# Patient Record
Sex: Male | Born: 1970 | Race: White | Hispanic: No | Marital: Married | Smoking: Never smoker
Health system: Southern US, Community
[De-identification: ages and names within clinical notes are randomized; demographics above are authoritative.]

## PROBLEM LIST (undated history)

## (undated) DIAGNOSIS — F419 Anxiety disorder, unspecified: Secondary | ICD-10-CM

## (undated) DIAGNOSIS — J189 Pneumonia, unspecified organism: Secondary | ICD-10-CM

## (undated) DIAGNOSIS — J45909 Unspecified asthma, uncomplicated: Secondary | ICD-10-CM

## (undated) HISTORY — DX: Unspecified asthma, uncomplicated: J45.909

## (undated) HISTORY — PX: WISDOM TOOTH EXTRACTION: SHX21

## (undated) HISTORY — PX: VASECTOMY: SHX75

## (undated) HISTORY — PX: NO PAST SURGERIES: SHX2092

---

## 2009-12-26 ENCOUNTER — Ambulatory Visit: Payer: Self-pay | Admitting: Family Medicine

## 2009-12-26 DIAGNOSIS — J069 Acute upper respiratory infection, unspecified: Secondary | ICD-10-CM | POA: Insufficient documentation

## 2010-11-29 NOTE — Assessment & Plan Note (Signed)
Summary: Cough-dry x  1dy rm 3   Vital Signs:  Patient Profile:   40 Years Old Male CC:      Cold & URI symptoms Height:     69 inches Weight:      217 pounds O2 Sat:      100 % O2 treatment:    Room Air Temp:     98.6 degrees F oral Pulse rate:   102 / minute Pulse rhythm:   regular Resp:     16 per minute BP sitting:   125 / 83  (right arm) Cuff size:   regular  Vitals Entered By: Areta Haber CMA (December 26, 2009 1:38 PM)                  Current Allergies: No known allergies History of Present Illness Chief Complaint: Cold & URI symptoms History of Present Illness: Subjective: Patient complains of non-productive cough that started 2 days ago, then yesterday developed chills and myalgias.  He has a past history of mild asthma as a child.  He has not had a flu shot.  He is flying to [place] in 3 days. No sore throat No pleuritic pain ? wheezing No nasal congestion No post-nasal drainage No sinus pain/pressure No itchy/red eyes No earache No hemoptysis No SOB No nausea No vomiting No abdominal pain No diarrhea No skin rashes + fatigue +  myalgias No headache Used OTC meds without relief   Current Problems: URI (ICD-465.9)   Current Meds ALKA-SELTZER PLUS COLD 2-7.8-325 MG TBEF (CHLORPHEN-PHENYLEPH-ASA) As directed AZITHROMYCIN 250 MG TABS (AZITHROMYCIN) Two tabs by mouth on day 1, then 1 tab daily on days 2 through 5 BENZONATATE 200 MG CAPS (BENZONATATE) One by mouth hs as needed cough  REVIEW OF SYSTEMS Constitutional Symptoms      Denies fever, chills, night sweats, weight loss, weight gain, and fatigue.  Eyes       Denies change in vision, eye pain, eye discharge, glasses, contact lenses, and eye surgery. Ear/Nose/Throat/Mouth       Denies hearing loss/aids, change in hearing, ear pain, ear discharge, dizziness, frequent runny nose, frequent nose bleeds, sinus problems, sore throat, hoarseness, and tooth pain or bleeding.  Respiratory  Complains of dry cough.      Denies productive cough, wheezing, shortness of breath, asthma, bronchitis, and emphysema/COPD.  Cardiovascular       Denies murmurs, chest pain, and tires easily with exhertion.    Gastrointestinal       Denies stomach pain, nausea/vomiting, diarrhea, constipation, blood in bowel movements, and indigestion. Genitourniary       Denies painful urination, kidney stones, and loss of urinary control. Neurological       Denies paralysis, seizures, and fainting/blackouts. Musculoskeletal       Denies muscle pain, joint pain, joint stiffness, decreased range of motion, redness, swelling, muscle weakness, and gout.  Skin       Denies bruising, unusual mles/lumps or sores, and hair/skin or nail changes.  Psych       Denies mood changes, temper/anger issues, anxiety/stress, speech problems, depression, and sleep problems. Other Comments: x 1 dy.    Past History:  Past Medical History: Unremarkable  Past Surgical History: Denies surgical history  Social History: Married Never Smoked Alcohol use-yes - 1 glass of red wine daily Drug use-no Regular exercise-no Smoking Status:  never Drug Use:  no Does Patient Exercise:  no   Objective:  Appearance:  Patient appears healthy, stated age, and in  no acute distress  Eyes:  Pupils are equal, round, and reactive to light and accomdation.  Extraocular movement is intact.  Conjunctivae are not inflamed.  Ears:  Canals normal.  Tympanic membranes normal.   Nose:  Normal septum.  Normal turbinates, mildly congested.   No sinus tenderness present.  Pharynx:  Normal  Neck:  Supple.  No adenopathy is present.  No thyromegaly is present  Lungs:   Faint bilateral wheezes present.  No rales or rhonchi.  Breath sounds are equal.  Heart:  Regular rate and rhythm without murmurs, rubs, or gallops.  Abdomen:  Nontender without masses or hepatosplenomegaly.  Bowel sounds are present.  No CVA or flank tenderness.  Extremities:   No edema.   Assessment New Problems: URI (ICD-465.9)  Viral URI? vs influenza?  Plan New Medications/Changes: BENZONATATE 200 MG CAPS (BENZONATATE) One by mouth hs as needed cough  #12 x 0, 12/26/2009, Donna Christen MD AZITHROMYCIN 250 MG TABS (AZITHROMYCIN) Two tabs by mouth on day 1, then 1 tab daily on days 2 through 5  #6 tabs x 0, 12/26/2009, Donna Christen MD  New Orders: New Patient Level III [phone] Planning Comments:   Begin Z-pack, expectorant daytime, cough suppressant at night, rest, increased fluids. Follow-up with PCP if not improved 10 to 14 days.   The patient and/or caregiver has been counseled thoroughly with regard to medications prescribed including dosage, schedule, interactions, rationale for use, and possible side effects and they verbalize understanding.  Diagnoses and expected course of recovery discussed and will return if not improved as expected or if the condition worsens. Patient and/or caregiver verbalized understanding.  Prescriptions: BENZONATATE 200 MG CAPS (BENZONATATE) One by mouth hs as needed cough  #12 x 0   Entered and Authorized by:   Donna Christen MD   Signed by:   Donna Christen MD on 12/26/2009   Method used:   Print then Give to Patient   RxID:   [id number] AZITHROMYCIN 250 MG TABS (AZITHROMYCIN) Two tabs by mouth on day 1, then 1 tab daily on days 2 through 5  #6 tabs x 0   Entered and Authorized by:   Donna Christen MD   Signed by:   Donna Christen MD on 12/26/2009   Method used:   Print then Give to Patient   RxID:   [id number]   Patient Instructions: 1)  May use Mucinex  (guaifenesin) twice daily for congestion. 2)  Increase fluid intake, rest. 3)  May take Ibuprofen 200mg , 4 tabs every 8 hours with food  4)  May use Afrin nasal spray (or generic oxymetazoline) twice daily for about 5 days if sinus congestion develops.   5)  Followup with family doctor if not improving 10 to 14 days.

## 2018-03-11 DIAGNOSIS — J45909 Unspecified asthma, uncomplicated: Secondary | ICD-10-CM | POA: Diagnosis not present

## 2018-03-11 DIAGNOSIS — J309 Allergic rhinitis, unspecified: Secondary | ICD-10-CM | POA: Diagnosis not present

## 2018-05-28 DIAGNOSIS — F411 Generalized anxiety disorder: Secondary | ICD-10-CM | POA: Diagnosis not present

## 2018-05-28 DIAGNOSIS — J45909 Unspecified asthma, uncomplicated: Secondary | ICD-10-CM | POA: Diagnosis not present

## 2018-06-07 DIAGNOSIS — J4520 Mild intermittent asthma, uncomplicated: Secondary | ICD-10-CM | POA: Diagnosis not present

## 2018-08-19 DIAGNOSIS — Z13220 Encounter for screening for lipoid disorders: Secondary | ICD-10-CM | POA: Diagnosis not present

## 2018-08-19 DIAGNOSIS — Z23 Encounter for immunization: Secondary | ICD-10-CM | POA: Diagnosis not present

## 2018-08-19 DIAGNOSIS — Z0000 Encounter for general adult medical examination without abnormal findings: Secondary | ICD-10-CM | POA: Diagnosis not present

## 2018-08-19 DIAGNOSIS — F411 Generalized anxiety disorder: Secondary | ICD-10-CM | POA: Diagnosis not present

## 2018-08-19 DIAGNOSIS — J45909 Unspecified asthma, uncomplicated: Secondary | ICD-10-CM | POA: Diagnosis not present

## 2018-09-15 DIAGNOSIS — M545 Low back pain: Secondary | ICD-10-CM | POA: Diagnosis not present

## 2018-09-25 DIAGNOSIS — H524 Presbyopia: Secondary | ICD-10-CM | POA: Diagnosis not present

## 2018-12-25 DIAGNOSIS — J309 Allergic rhinitis, unspecified: Secondary | ICD-10-CM | POA: Diagnosis not present

## 2018-12-25 DIAGNOSIS — J45909 Unspecified asthma, uncomplicated: Secondary | ICD-10-CM | POA: Diagnosis not present

## 2019-01-23 DIAGNOSIS — J4520 Mild intermittent asthma, uncomplicated: Secondary | ICD-10-CM | POA: Diagnosis not present

## 2019-02-19 DIAGNOSIS — J4541 Moderate persistent asthma with (acute) exacerbation: Secondary | ICD-10-CM | POA: Diagnosis not present

## 2019-02-19 DIAGNOSIS — F411 Generalized anxiety disorder: Secondary | ICD-10-CM | POA: Diagnosis not present

## 2019-02-25 DIAGNOSIS — R7989 Other specified abnormal findings of blood chemistry: Secondary | ICD-10-CM | POA: Diagnosis not present

## 2019-02-25 DIAGNOSIS — E291 Testicular hypofunction: Secondary | ICD-10-CM | POA: Diagnosis not present

## 2019-02-28 DIAGNOSIS — E291 Testicular hypofunction: Secondary | ICD-10-CM | POA: Diagnosis not present

## 2019-02-28 DIAGNOSIS — R6882 Decreased libido: Secondary | ICD-10-CM | POA: Diagnosis not present

## 2019-02-28 DIAGNOSIS — R5383 Other fatigue: Secondary | ICD-10-CM | POA: Diagnosis not present

## 2019-02-28 DIAGNOSIS — R7989 Other specified abnormal findings of blood chemistry: Secondary | ICD-10-CM | POA: Diagnosis not present

## 2019-04-08 DIAGNOSIS — E291 Testicular hypofunction: Secondary | ICD-10-CM | POA: Diagnosis not present

## 2019-04-11 DIAGNOSIS — E291 Testicular hypofunction: Secondary | ICD-10-CM | POA: Diagnosis not present

## 2019-04-11 DIAGNOSIS — R5383 Other fatigue: Secondary | ICD-10-CM | POA: Diagnosis not present

## 2019-04-11 DIAGNOSIS — K649 Unspecified hemorrhoids: Secondary | ICD-10-CM | POA: Diagnosis not present

## 2019-04-12 DIAGNOSIS — R5383 Other fatigue: Secondary | ICD-10-CM | POA: Diagnosis not present

## 2019-07-29 DIAGNOSIS — M9903 Segmental and somatic dysfunction of lumbar region: Secondary | ICD-10-CM | POA: Diagnosis not present

## 2019-07-29 DIAGNOSIS — M9905 Segmental and somatic dysfunction of pelvic region: Secondary | ICD-10-CM | POA: Diagnosis not present

## 2019-07-29 DIAGNOSIS — M545 Low back pain: Secondary | ICD-10-CM | POA: Diagnosis not present

## 2019-07-29 DIAGNOSIS — M25551 Pain in right hip: Secondary | ICD-10-CM | POA: Diagnosis not present

## 2019-08-01 DIAGNOSIS — M9904 Segmental and somatic dysfunction of sacral region: Secondary | ICD-10-CM | POA: Diagnosis not present

## 2019-08-01 DIAGNOSIS — M9902 Segmental and somatic dysfunction of thoracic region: Secondary | ICD-10-CM | POA: Diagnosis not present

## 2019-08-01 DIAGNOSIS — M9905 Segmental and somatic dysfunction of pelvic region: Secondary | ICD-10-CM | POA: Diagnosis not present

## 2019-08-01 DIAGNOSIS — M9903 Segmental and somatic dysfunction of lumbar region: Secondary | ICD-10-CM | POA: Diagnosis not present

## 2019-08-08 DIAGNOSIS — M9903 Segmental and somatic dysfunction of lumbar region: Secondary | ICD-10-CM | POA: Diagnosis not present

## 2019-08-08 DIAGNOSIS — M792 Neuralgia and neuritis, unspecified: Secondary | ICD-10-CM | POA: Diagnosis not present

## 2019-08-08 DIAGNOSIS — M9904 Segmental and somatic dysfunction of sacral region: Secondary | ICD-10-CM | POA: Diagnosis not present

## 2019-08-08 DIAGNOSIS — M9905 Segmental and somatic dysfunction of pelvic region: Secondary | ICD-10-CM | POA: Diagnosis not present

## 2019-08-15 DIAGNOSIS — M9904 Segmental and somatic dysfunction of sacral region: Secondary | ICD-10-CM | POA: Diagnosis not present

## 2019-08-15 DIAGNOSIS — M9905 Segmental and somatic dysfunction of pelvic region: Secondary | ICD-10-CM | POA: Diagnosis not present

## 2019-08-15 DIAGNOSIS — M9902 Segmental and somatic dysfunction of thoracic region: Secondary | ICD-10-CM | POA: Diagnosis not present

## 2019-08-15 DIAGNOSIS — M9903 Segmental and somatic dysfunction of lumbar region: Secondary | ICD-10-CM | POA: Diagnosis not present

## 2019-08-29 DIAGNOSIS — M9904 Segmental and somatic dysfunction of sacral region: Secondary | ICD-10-CM | POA: Diagnosis not present

## 2019-08-29 DIAGNOSIS — M9902 Segmental and somatic dysfunction of thoracic region: Secondary | ICD-10-CM | POA: Diagnosis not present

## 2019-08-29 DIAGNOSIS — M9905 Segmental and somatic dysfunction of pelvic region: Secondary | ICD-10-CM | POA: Diagnosis not present

## 2019-08-29 DIAGNOSIS — M9903 Segmental and somatic dysfunction of lumbar region: Secondary | ICD-10-CM | POA: Diagnosis not present

## 2019-09-12 DIAGNOSIS — M9905 Segmental and somatic dysfunction of pelvic region: Secondary | ICD-10-CM | POA: Diagnosis not present

## 2019-09-12 DIAGNOSIS — M792 Neuralgia and neuritis, unspecified: Secondary | ICD-10-CM | POA: Diagnosis not present

## 2019-09-12 DIAGNOSIS — M9903 Segmental and somatic dysfunction of lumbar region: Secondary | ICD-10-CM | POA: Diagnosis not present

## 2019-09-12 DIAGNOSIS — M9904 Segmental and somatic dysfunction of sacral region: Secondary | ICD-10-CM | POA: Diagnosis not present

## 2019-10-10 DIAGNOSIS — M792 Neuralgia and neuritis, unspecified: Secondary | ICD-10-CM | POA: Diagnosis not present

## 2019-10-10 DIAGNOSIS — M9904 Segmental and somatic dysfunction of sacral region: Secondary | ICD-10-CM | POA: Diagnosis not present

## 2019-10-10 DIAGNOSIS — M9903 Segmental and somatic dysfunction of lumbar region: Secondary | ICD-10-CM | POA: Diagnosis not present

## 2019-10-10 DIAGNOSIS — M9905 Segmental and somatic dysfunction of pelvic region: Secondary | ICD-10-CM | POA: Diagnosis not present

## 2019-10-22 DIAGNOSIS — M9905 Segmental and somatic dysfunction of pelvic region: Secondary | ICD-10-CM | POA: Diagnosis not present

## 2019-10-22 DIAGNOSIS — M792 Neuralgia and neuritis, unspecified: Secondary | ICD-10-CM | POA: Diagnosis not present

## 2019-10-22 DIAGNOSIS — M9903 Segmental and somatic dysfunction of lumbar region: Secondary | ICD-10-CM | POA: Diagnosis not present

## 2019-10-22 DIAGNOSIS — M9904 Segmental and somatic dysfunction of sacral region: Secondary | ICD-10-CM | POA: Diagnosis not present

## 2019-11-25 DIAGNOSIS — M9905 Segmental and somatic dysfunction of pelvic region: Secondary | ICD-10-CM | POA: Diagnosis not present

## 2019-11-25 DIAGNOSIS — M9903 Segmental and somatic dysfunction of lumbar region: Secondary | ICD-10-CM | POA: Diagnosis not present

## 2019-11-25 DIAGNOSIS — M9904 Segmental and somatic dysfunction of sacral region: Secondary | ICD-10-CM | POA: Diagnosis not present

## 2019-11-25 DIAGNOSIS — M792 Neuralgia and neuritis, unspecified: Secondary | ICD-10-CM | POA: Diagnosis not present

## 2019-12-14 DIAGNOSIS — M79605 Pain in left leg: Secondary | ICD-10-CM | POA: Diagnosis not present

## 2019-12-14 DIAGNOSIS — Z791 Long term (current) use of non-steroidal anti-inflammatories (NSAID): Secondary | ICD-10-CM | POA: Diagnosis not present

## 2019-12-14 DIAGNOSIS — M5440 Lumbago with sciatica, unspecified side: Secondary | ICD-10-CM | POA: Diagnosis not present

## 2019-12-14 DIAGNOSIS — M545 Low back pain: Secondary | ICD-10-CM | POA: Diagnosis not present

## 2019-12-14 DIAGNOSIS — Z79899 Other long term (current) drug therapy: Secondary | ICD-10-CM | POA: Diagnosis not present

## 2019-12-14 DIAGNOSIS — M5442 Lumbago with sciatica, left side: Secondary | ICD-10-CM | POA: Diagnosis not present

## 2019-12-15 ENCOUNTER — Encounter: Payer: Self-pay | Admitting: Sports Medicine

## 2019-12-15 ENCOUNTER — Ambulatory Visit (INDEPENDENT_AMBULATORY_CARE_PROVIDER_SITE_OTHER): Payer: BLUE CROSS/BLUE SHIELD

## 2019-12-15 ENCOUNTER — Other Ambulatory Visit: Payer: Self-pay

## 2019-12-15 ENCOUNTER — Ambulatory Visit (INDEPENDENT_AMBULATORY_CARE_PROVIDER_SITE_OTHER): Payer: BLUE CROSS/BLUE SHIELD | Admitting: Sports Medicine

## 2019-12-15 DIAGNOSIS — M5416 Radiculopathy, lumbar region: Secondary | ICD-10-CM

## 2019-12-15 DIAGNOSIS — M545 Low back pain: Secondary | ICD-10-CM | POA: Diagnosis not present

## 2019-12-15 MED ORDER — KETOROLAC TROMETHAMINE 60 MG/2ML IM SOLN
60.0000 mg | Freq: Once | INTRAMUSCULAR | Status: AC
  Administered 2019-12-15: 14:00:00 60 mg via INTRAMUSCULAR

## 2019-12-15 MED ORDER — PREDNISONE 50 MG PO TABS: ORAL_TABLET | ORAL | 0 refills | Status: DC

## 2019-12-15 MED ORDER — OXYCODONE-ACETAMINOPHEN 10-325 MG PO TABS: 1.0000 | ORAL_TABLET | Freq: Three times a day (TID) | ORAL | 0 refills | Status: DC | PRN

## 2019-12-15 MED ORDER — METHYLPREDNISOLONE SODIUM SUCC 125 MG IJ SOLR
125.0000 mg | Freq: Once | INTRAMUSCULAR | Status: AC
  Administered 2019-12-15: 125 mg via INTRAMUSCULAR

## 2019-12-15 NOTE — Addendum Note (Signed)
Addended by: Monica Becton on: 12/15/2019 04:55 PM   Modules accepted: Orders

## 2019-12-15 NOTE — Assessment & Plan Note (Addendum)
Maurice Chavez has had severe axial low back pain with radiation down the left leg in an L5 and S1 distribution for the past several days, no injuries, no constitutional symptoms. He went to the emergency department, he got some hydrocodone which is not touching his pain, he also got some steroids. Discontinue all medicines from the ED, Toradol 60, Solu-Medrol 125 intramuscular today. Adding high-dose Percocet. 5 days of prednisone. Because he has progressive weakness of his left lower extremity both the hamstring flexion and has lost his Achilles reflex on the left we are going to proceed with an MRI as well today. Adding formal physical therapy. Return to see me in 4 to 6 weeks, I did discuss the developmental anthropology of degenerative disc disease as well as gave him anticipatory guidance for possible future treatments.   I have personally reviewed [name]'s x-rays and MRI, he has L4-L5 and L5-S1 degenerative changes on his x-ray, MRI official report is not back yet but I do see a large L5-S1 left-sided disc sequestration with compression of the intraspinal S1 nerve root, the L5 nerve root for the most part looks unimpeded. Because of his weakness/loss of his left Achilles reflex and size of disc extrusion/sequestration I would like a surgical evaluation from Dr. Yevette Edwards.

## 2019-12-15 NOTE — Progress Notes (Addendum)
    Procedures performed today:    None.  Independent interpretation of tests performed by another provider:   I have personally reviewed Maurice Chavez's x-rays and MRI, he has L4-L5 and L5-S1 degenerative changes on his x-ray, MRI official report is not back yet but I do see a large L5-S1 left-sided disc sequestration with compression of the intraspinal S1 nerve root, the L5 nerve root for the most part looks unimpeded.  Impression and Recommendations:    Left lumbar radiculopathy Maurice Chavez has had severe axial low back pain with radiation down the left leg in an L5 and S1 distribution for the past several days, no injuries, no constitutional symptoms. He went to the emergency department, he got some hydrocodone which is not touching his pain, he also got some steroids. Discontinue all medicines from the ED, Toradol 60, Solu-Medrol 125 intramuscular today. Adding high-dose Percocet. 5 days of prednisone. Because he has progressive weakness of his left lower extremity both the hamstring flexion and has lost his Achilles reflex on the left we are going to proceed with an MRI as well today. Adding formal physical therapy. Return to see me in 4 to 6 weeks, I did discuss the developmental anthropology of degenerative disc disease as well as gave him anticipatory guidance for possible future treatments.   I have personally reviewed Maurice Chavez's x-rays and MRI, he has L4-L5 and L5-S1 degenerative changes on his x-ray, MRI official report is not back yet but I do see a large L5-S1 left-sided disc sequestration with compression of the intraspinal S1 nerve root, the L5 nerve root for the most part looks unimpeded. Because of his weakness/loss of his left Achilles reflex and size of disc extrusion/sequestration I would like a surgical evaluation from Dr. Yevette Edwards.    ___________________________________________ Maurice Chavez. Benjamin Stain, M.D., ABFM., CAQSM. Primary Care and Sports Medicine [facility]  [facility]  Adjunct Instructor of Family Medicine  [employer]

## 2019-12-15 NOTE — Addendum Note (Signed)
Addended by: Juel Burrow on: 12/15/2019 02:17 PM   Modules accepted: Orders

## 2019-12-22 DIAGNOSIS — M5416 Radiculopathy, lumbar region: Secondary | ICD-10-CM | POA: Diagnosis not present

## 2019-12-23 ENCOUNTER — Other Ambulatory Visit: Payer: Self-pay | Admitting: Orthopedic Surgery

## 2019-12-26 NOTE — Progress Notes (Addendum)
[id number] [address] [place] [address] Phone: [phone] Fax: [phone]    Your procedure is scheduled on  Wednesday, March 3rd  Report to [facility] Main Entrance "A" at 1:30 P.M., and check in at the Admitting office.  Call this number if you have problems the morning of surgery:  [phone]  Call [phone] if you have any questions prior to your surgery date Monday-Friday 8am-4pm   Remember:  Do not eat after midnight the night before your surgery  You may drink clear liquids until 1:30 P.M. the afternoon of sugery.   Clear liquids allowed are: Water, Non-Citrus Juices (without pulp), Carbonated Beverages, Clear Tea, Black Coffee Only, and Gatorade   Enhanced Recovery after Surgery for Orthopedics Enhanced Recovery after Surgery is a protocol used to improve the stress on your body and your recovery after surgery.  Patient Instructions  . The night before surgery:  o No food after midnight. ONLY clear liquids after midnight  .  Marland Kitchen The day of surgery (if you do NOT have diabetes):  o Drink ONE (1) Pre-Surgery Clear Ensure as directed. o Finish the drink at the designated time by 1:30 P.M. the afternoon of your surgery. o Nothing else to drink after completing the  Pre-Surgery Clear Ensure.         If you have questions, please contact your surgeon's office.   Take these medicines the morning of surgery with A SIP OF WATER citalopram (CELEXA)  fluticasone furoate-vilanterol (BREO ELLIPTA)   If needed - acetaminophen (TYLENOL), cyclobenzaprine (FLEXERIL), gabapentin (NEURONTIN), oxyCODONE-acetaminophen (PERCOCET/ROXICET),  Polyethyl Glycol-Propyl Glycol (SYSTANE OP)/eye drops  As of today, STOP taking any Aspirin (unless otherwise instructed by your surgeon), Aleve, Naproxen, Ibuprofen, Motrin, Advil, Goody's, BC's, all herbal medications, fish oil, and all vitamins.   The Morning of Surgery  Do not  wear jewelry.  Do not wear lotions, powders, colognes, or deodorant  Men may shave face and neck.  Do not bring valuables to the hospital.  [facility] is not responsible for any belongings or valuables.  If you are a smoker, DO NOT Smoke 24 hours prior to surgery  If you wear a CPAP at night please bring your mask the morning of surgery   Remember that you must have someone to transport you home after your surgery, and remain with you for 24 hours if you are discharged the same day.  Please bring cases for contacts, glasses, hearing aids, dentures or bridgework because it cannot be worn into surgery.   Leave your suitcase in the car.  After surgery it may be brought to your room.  For patients admitted to the hospital, discharge time will be determined by your treatment team.  Patients discharged the day of surgery will not be allowed to drive home.   Special instructions:   [facility]- Preparing For Surgery  Before surgery, you can play an important role. Because skin is not sterile, your skin needs to be as free of germs as possible. You can reduce the number of germs on your skin by washing with CHG (chlorahexidine gluconate) Soap before surgery.  CHG is an antiseptic cleaner which kills germs and bonds with the skin to continue killing germs even after washing.    Oral Hygiene is also important to reduce your risk of infection.  Remember - BRUSH YOUR TEETH THE MORNING OF SURGERY WITH YOUR REGULAR TOOTHPASTE  Please do not use if you have an  allergy to CHG or antibacterial soaps. If your skin becomes reddened/irritated stop using the CHG.  Do not shave (including legs and underarms) for at least 48 hours prior to first CHG shower. It is OK to shave your face.  Please follow these instructions carefully.   1. Shower the NIGHT BEFORE SURGERY and the MORNING OF SURGERY with CHG Soap.   2. If you chose to wash your hair, wash your hair first as usual with your normal  shampoo.  3. After you shampoo, rinse your hair and body thoroughly to remove the shampoo.  4. Use CHG as you would any other liquid soap. You can apply CHG directly to the skin and wash gently with a scrungie or a clean washcloth.   5. Apply the CHG Soap to your body ONLY FROM THE NECK DOWN.  Do not use on open wounds or open sores. Avoid contact with your eyes, ears, mouth and genitals (private parts). Wash Face and genitals (private parts)  with your normal soap.   6. Wash thoroughly, paying special attention to the area where your surgery will be performed.  7. Thoroughly rinse your body with warm water from the neck down.  8. DO NOT shower/wash with your normal soap after using and rinsing off the CHG Soap.  9. Pat yourself dry with a CLEAN TOWEL.  10. Wear CLEAN PAJAMAS to bed the night before surgery, wear comfortable clothes the morning of surgery  11. Place CLEAN SHEETS on your bed the night of your first shower and DO NOT SLEEP WITH PETS.  Day of Surgery: Please shower the morning of surgery with the CHG soap Do not apply any deodorants/lotions. Please wear clean clothes to the hospital/surgery center.   Remember to brush your teeth WITH YOUR REGULAR TOOTHPASTE.  Please read over the following fact sheets that you were given.

## 2019-12-29 ENCOUNTER — Encounter (HOSPITAL_COMMUNITY): Payer: Self-pay

## 2019-12-29 ENCOUNTER — Encounter (HOSPITAL_COMMUNITY)
Admission: RE | Admit: 2019-12-29 | Discharge: 2019-12-29 | Disposition: A | Discharge status: Home / Self Care | Payer: BLUE CROSS/BLUE SHIELD | Source: Ambulatory Visit | Attending: Orthopedic Surgery | Admitting: Orthopedic Surgery

## 2019-12-29 ENCOUNTER — Other Ambulatory Visit: Payer: Self-pay

## 2019-12-29 ENCOUNTER — Other Ambulatory Visit (HOSPITAL_COMMUNITY)
Admission: RE | Admit: 2019-12-29 | Discharge: 2019-12-29 | Disposition: A | Discharge status: Home / Self Care | Payer: BLUE CROSS/BLUE SHIELD | Source: Ambulatory Visit | Attending: Orthopedic Surgery | Admitting: Orthopedic Surgery

## 2019-12-29 DIAGNOSIS — M5416 Radiculopathy, lumbar region: Secondary | ICD-10-CM | POA: Diagnosis not present

## 2019-12-29 DIAGNOSIS — Z20822 Contact with and (suspected) exposure to covid-19: Secondary | ICD-10-CM | POA: Insufficient documentation

## 2019-12-29 DIAGNOSIS — Z01812 Encounter for preprocedural laboratory examination: Secondary | ICD-10-CM | POA: Diagnosis not present

## 2019-12-29 HISTORY — DX: Anxiety disorder, unspecified: F41.9

## 2019-12-29 HISTORY — DX: Pneumonia, unspecified organism: J18.9

## 2019-12-29 LAB — CBC WITH DIFFERENTIAL/PLATELET
Abs Immature Granulocytes: 0.03 10*3/uL (ref 0.00–0.07)
Basophils Absolute: 0.1 10*3/uL (ref 0.0–0.1)
Basophils Relative: 1 %
Eosinophils Absolute: 0 10*3/uL (ref 0.0–0.5)
Eosinophils Relative: 0 %
HCT: 50.2 % (ref 39.0–52.0)
Hemoglobin: 16.1 g/dL (ref 13.0–17.0)
Immature Granulocytes: 0 %
Lymphocytes Relative: 43 %
Lymphs Abs: 3.8 10*3/uL (ref 0.7–4.0)
MCH: 29.9 pg (ref 26.0–34.0)
MCHC: 32.1 g/dL (ref 30.0–36.0)
MCV: 93.1 fL (ref 80.0–100.0)
Monocytes Absolute: 0.6 10*3/uL (ref 0.1–1.0)
Monocytes Relative: 7 %
Neutro Abs: 4.4 10*3/uL (ref 1.7–7.7)
Neutrophils Relative %: 49 %
Platelets: 275 10*3/uL (ref 150–400)
RBC: 5.39 MIL/uL (ref 4.22–5.81)
RDW: 12.6 % (ref 11.5–15.5)
WBC: 8.9 10*3/uL (ref 4.0–10.5)
nRBC: 0 % (ref 0.0–0.2)

## 2019-12-29 LAB — COMPREHENSIVE METABOLIC PANEL
ALT: 36 U/L (ref 0–44)
AST: 27 U/L (ref 15–41)
Albumin: 4.2 g/dL (ref 3.5–5.0)
Alkaline Phosphatase: 35 U/L — ABNORMAL LOW (ref 38–126)
Anion gap: 10 (ref 5–15)
BUN: 15 mg/dL (ref 6–20)
CO2: 29 mmol/L (ref 22–32)
Calcium: 9.6 mg/dL (ref 8.9–10.3)
Chloride: 100 mmol/L (ref 98–111)
Creatinine, Ser: 1.12 mg/dL (ref 0.61–1.24)
GFR calc Af Amer: >60 mL/min (ref >60)
GFR calc non Af Amer: >60 mL/min (ref >60)
Glucose, Bld: 115 mg/dL — ABNORMAL HIGH (ref 70–99)
Potassium: 3.8 mmol/L (ref 3.5–5.1)
Sodium: 139 mmol/L (ref 135–145)
Total Bilirubin: 0.7 mg/dL (ref 0.3–1.2)
Total Protein: 7 g/dL (ref 6.5–8.1)

## 2019-12-29 LAB — ABO/RH: ABO/RH(D): A POS

## 2019-12-29 LAB — SURGICAL PCR SCREEN
MRSA, PCR: NEGATIVE
Staphylococcus aureus: NEGATIVE

## 2019-12-29 LAB — URINALYSIS, ROUTINE W REFLEX MICROSCOPIC
Bilirubin Urine: NEGATIVE
Glucose, UA: NEGATIVE mg/dL
Hgb urine dipstick: NEGATIVE
Ketones, ur: NEGATIVE mg/dL
Leukocytes,Ua: NEGATIVE
Nitrite: NEGATIVE
Protein, ur: NEGATIVE mg/dL
Specific Gravity, Urine: 1.013 (ref 1.005–1.030)
pH: 7 (ref 5.0–8.0)

## 2019-12-29 LAB — TYPE AND SCREEN
ABO/RH(D): A POS
Antibody Screen: NEGATIVE

## 2019-12-29 LAB — PROTIME-INR
INR: 0.9 (ref 0.8–1.2)
Prothrombin Time: 12.5 seconds (ref 11.4–15.2)

## 2019-12-29 LAB — SARS CORONAVIRUS 2 (TAT 6-24 HRS): SARS Coronavirus 2: NEGATIVE

## 2019-12-29 LAB — APTT: aPTT: 27 seconds (ref 24–36)

## 2019-12-29 NOTE — Progress Notes (Signed)
PCP - [facility] in Cardiologist - na    Chest x-ray - na EKG - na Stress Test - na ECHO - na Cardiac Cath - na  Sleep Study - na CPAP -    Blood Thinner Instructions:na Aspirin Instructions:na  ERAS Protcol -yes PRE-SURGERY Ensure given  COVID TEST- 12/29/19   Anesthesia review: elevated blood pressure at pre-admission  Patient denies shortness of breath, fever, cough and chest pain at PAT appointment   All instructions explained to the patient, with a verbal understanding of the material. Patient agrees to go over the instructions while at home for a better understanding. Patient also instructed to self quarantine after being tested for COVID-19. The opportunity to ask questions was provided.

## 2019-12-30 NOTE — Progress Notes (Signed)
Anesthesia Chart Review:  Pt's BP elevated on arrival to PAT. Initially 139/105. Improved to 135/98 after rest. Pt relates this to pain, denies history of HTN, does not take antiHTN meds. No history of cardiovascular disease. He denies any CV symptoms, no CP, SOB, exertional dyspnea. Review of previous PCP notes in care everywhere shows normotensive readings. Elevated BP likely related to acute pain. Discussed if it was markedly elevated on DOS it could be cause for cancellation. Pt verbalized understanding. Dicussed checking BP at home and following up with PCP if remains elevated.   Follows yearly with pulmonology for history of moderate persistent asthma, maintained on Breo inhaler and PRN albuterol. Normal spirometry in 2019 and 2020.   Preop labs reviewed, unremarkable.    [facility] Short Stay Center/Anesthesiology Phone [phone] 12/30/2019 9:12 AM

## 2019-12-30 NOTE — Anesthesia Preprocedure Evaluation (Addendum)
Anesthesia Evaluation  Patient identified by MRN, date of birth, ID band Patient awake    Reviewed: Allergy & Precautions, NPO status , Patient's Chart, lab work & pertinent test results  Airway Mallampati: II   Neck ROM: Full    Dental no notable dental hx. (+) Teeth Intact, Dental Advisory Given   Pulmonary asthma , pneumonia, resolved,    Pulmonary exam normal breath sounds clear to auscultation       Cardiovascular negative cardio ROS Normal cardiovascular exam Rhythm:Regular Rate:Normal     Neuro/Psych Anxiety  Neuromuscular disease    GI/Hepatic negative GI ROS, Neg liver ROS,   Endo/Other  negative endocrine ROS  Renal/GU negative Renal ROS  negative genitourinary   Musculoskeletal Left L1-S1 HNP with left sided radiculopathy   Abdominal   Peds  Hematology negative hematology ROS (+)   Anesthesia Other Findings   Reproductive/Obstetrics                            Anesthesia Physical Anesthesia Plan  ASA: II  Anesthesia Plan: General   Post-op Pain Management:    Induction: Intravenous  PONV Risk Score and Plan: 3 and Ondansetron, Treatment may vary due to age or medical condition, Midazolam and Dexamethasone  Airway Management Planned: Oral ETT  Additional Equipment:   Intra-op Plan:   Post-operative Plan: Extubation in OR  Informed Consent: I have reviewed the patients History and Physical, chart, labs and discussed the procedure including the risks, benefits and alternatives for the proposed anesthesia with the patient or authorized representative who has indicated his/her understanding and acceptance.     Dental advisory given  Plan Discussed with:   Anesthesia Plan Comments: (Pt's BP elevated on arrival to PAT. Initially 139/105. Improved to 135/98 after rest. Pt relates this to pain, denies history of HTN, does not take antiHTN meds. No history of cardiovascular  disease. He denies any CV symptoms, no CP, SOB, exertional dyspnea. Review of previous PCP notes in care everywhere shows normotensive readings. Elevated BP likely related to acute pain. Discussed if it was markedly elevated on DOS it could be cause for cancellation. Pt verbalized understanding. Dicussed checking BP at home and following up with PCP if remains elevated.   Follows yearly with pulmonology for history of moderate persistent asthma, maintained on Breo inhaler and PRN albuterol. Normal spirometry in 2019 and 2020.   Preop labs reviewed, unremarkable. )       Anesthesia Quick Evaluation

## 2019-12-31 ENCOUNTER — Ambulatory Visit (HOSPITAL_COMMUNITY): Payer: BLUE CROSS/BLUE SHIELD | Admitting: Physician Assistant

## 2019-12-31 ENCOUNTER — Encounter (HOSPITAL_COMMUNITY)
Admission: RE | Disposition: A | Discharge status: Home / Self Care | Payer: Self-pay | Source: Ambulatory Visit | Attending: Orthopedic Surgery

## 2019-12-31 ENCOUNTER — Ambulatory Visit (HOSPITAL_COMMUNITY): Payer: BLUE CROSS/BLUE SHIELD

## 2019-12-31 ENCOUNTER — Ambulatory Visit (HOSPITAL_COMMUNITY): Payer: BLUE CROSS/BLUE SHIELD | Admitting: Anesthesiology

## 2019-12-31 ENCOUNTER — Other Ambulatory Visit: Payer: Self-pay

## 2019-12-31 ENCOUNTER — Ambulatory Visit (HOSPITAL_COMMUNITY)
Admission: RE | Admit: 2019-12-31 | Discharge: 2019-12-31 | Disposition: A | Discharge status: Home / Self Care | Payer: BLUE CROSS/BLUE SHIELD | Source: Ambulatory Visit | Attending: Orthopedic Surgery | Admitting: Orthopedic Surgery

## 2019-12-31 ENCOUNTER — Encounter (HOSPITAL_COMMUNITY): Payer: Self-pay | Admitting: Orthopedic Surgery

## 2019-12-31 DIAGNOSIS — M79605 Pain in left leg: Secondary | ICD-10-CM | POA: Diagnosis not present

## 2019-12-31 DIAGNOSIS — Z7951 Long term (current) use of inhaled steroids: Secondary | ICD-10-CM | POA: Insufficient documentation

## 2019-12-31 DIAGNOSIS — M5117 Intervertebral disc disorders with radiculopathy, lumbosacral region: Secondary | ICD-10-CM | POA: Diagnosis not present

## 2019-12-31 DIAGNOSIS — Z419 Encounter for procedure for purposes other than remedying health state, unspecified: Secondary | ICD-10-CM

## 2019-12-31 DIAGNOSIS — M5416 Radiculopathy, lumbar region: Secondary | ICD-10-CM | POA: Diagnosis not present

## 2019-12-31 DIAGNOSIS — J45909 Unspecified asthma, uncomplicated: Secondary | ICD-10-CM | POA: Diagnosis not present

## 2019-12-31 DIAGNOSIS — Z981 Arthrodesis status: Secondary | ICD-10-CM | POA: Diagnosis not present

## 2019-12-31 DIAGNOSIS — F419 Anxiety disorder, unspecified: Secondary | ICD-10-CM | POA: Diagnosis not present

## 2019-12-31 HISTORY — PX: LUMBAR LAMINECTOMY/DECOMPRESSION MICRODISCECTOMY: SHX5026

## 2019-12-31 SURGERY — LUMBAR LAMINECTOMY/DECOMPRESSION MICRODISCECTOMY
Anesthesia: General | Site: Spine Lumbar | Laterality: Left

## 2019-12-31 MED ORDER — METHYLENE BLUE 0.5 % INJ SOLN
INTRAVENOUS | Status: DC | PRN
  Administered 2019-12-31: 1 mL

## 2019-12-31 MED ORDER — PHENYLEPHRINE 40 MCG/ML (10ML) SYRINGE FOR IV PUSH (FOR BLOOD PRESSURE SUPPORT)
PREFILLED_SYRINGE | INTRAVENOUS | Status: DC | PRN
  Administered 2019-12-31 (×5): 80 ug via INTRAVENOUS

## 2019-12-31 MED ORDER — BUPIVACAINE HCL (PF) 0.25 % IJ SOLN
INTRAMUSCULAR | Status: DC | PRN
  Administered 2019-12-31: 7 mL
  Administered 2019-12-31: 23 mL

## 2019-12-31 MED ORDER — CEFAZOLIN SODIUM-DEXTROSE 2-4 GM/100ML-% IV SOLN
2.0000 g | INTRAVENOUS | Status: AC
  Administered 2019-12-31: 15:00:00 2 g via INTRAVENOUS

## 2019-12-31 MED ORDER — ROCURONIUM BROMIDE 10 MG/ML (PF) SYRINGE
PREFILLED_SYRINGE | INTRAVENOUS | Status: AC
  Filled 2019-12-31: qty 10

## 2019-12-31 MED ORDER — ONDANSETRON HCL 4 MG/2ML IJ SOLN
INTRAMUSCULAR | Status: DC | PRN
  Administered 2019-12-31: 4 mg via INTRAVENOUS

## 2019-12-31 MED ORDER — DEXAMETHASONE SODIUM PHOSPHATE 10 MG/ML IJ SOLN
INTRAMUSCULAR | Status: AC
  Filled 2019-12-31: qty 1

## 2019-12-31 MED ORDER — HYDROCODONE-ACETAMINOPHEN 7.5-325 MG PO TABS
ORAL_TABLET | ORAL | Status: AC
  Filled 2019-12-31: qty 1

## 2019-12-31 MED ORDER — FENTANYL CITRATE (PF) 250 MCG/5ML IJ SOLN
INTRAMUSCULAR | Status: AC
  Filled 2019-12-31: qty 5

## 2019-12-31 MED ORDER — ONDANSETRON HCL 4 MG/2ML IJ SOLN
INTRAMUSCULAR | Status: AC
  Filled 2019-12-31: qty 2

## 2019-12-31 MED ORDER — DEXMEDETOMIDINE HCL 200 MCG/2ML IV SOLN
INTRAVENOUS | Status: DC | PRN
  Administered 2019-12-31: 12 ug via INTRAVENOUS
  Administered 2019-12-31: 8 ug via INTRAVENOUS

## 2019-12-31 MED ORDER — FENTANYL CITRATE (PF) 100 MCG/2ML IJ SOLN
INTRAMUSCULAR | Status: DC | PRN
  Administered 2019-12-31: 250 ug via INTRAVENOUS

## 2019-12-31 MED ORDER — METHYLENE BLUE 0.5 % INJ SOLN
INTRAVENOUS | Status: AC
  Filled 2019-12-31: qty 10

## 2019-12-31 MED ORDER — HEMOSTATIC AGENTS (NO CHARGE) OPTIME
TOPICAL | Status: DC | PRN
  Administered 2019-12-31: 1

## 2019-12-31 MED ORDER — LIDOCAINE 2% (20 MG/ML) 5 ML SYRINGE
INTRAMUSCULAR | Status: AC
  Filled 2019-12-31: qty 5

## 2019-12-31 MED ORDER — BUPIVACAINE LIPOSOME 1.3 % IJ SUSP
INTRAMUSCULAR | Status: DC | PRN
  Administered 2019-12-31: 20 mL

## 2019-12-31 MED ORDER — LIDOCAINE 2% (20 MG/ML) 5 ML SYRINGE
INTRAMUSCULAR | Status: DC | PRN
  Administered 2019-12-31: 80 mg via INTRAVENOUS
  Administered 2019-12-31: 20 mg via INTRAVENOUS

## 2019-12-31 MED ORDER — METHYLPREDNISOLONE ACETATE 40 MG/ML IJ SUSP
INTRAMUSCULAR | Status: AC
  Filled 2019-12-31: qty 1

## 2019-12-31 MED ORDER — POVIDONE-IODINE 7.5 % EX SOLN: Freq: Once | CUTANEOUS | Status: DC

## 2019-12-31 MED ORDER — ONDANSETRON HCL 4 MG/2ML IJ SOLN: 4.0000 mg | Freq: Once | INTRAMUSCULAR | Status: DC | PRN

## 2019-12-31 MED ORDER — THROMBIN 20000 UNITS EX SOLR: CUTANEOUS | Status: DC | PRN

## 2019-12-31 MED ORDER — CEFAZOLIN SODIUM-DEXTROSE 2-4 GM/100ML-% IV SOLN
INTRAVENOUS | Status: AC
  Filled 2019-12-31: qty 100

## 2019-12-31 MED ORDER — PROPOFOL 10 MG/ML IV BOLUS
INTRAVENOUS | Status: AC
  Filled 2019-12-31: qty 20

## 2019-12-31 MED ORDER — BUPIVACAINE HCL (PF) 0.25 % IJ SOLN
INTRAMUSCULAR | Status: AC
  Filled 2019-12-31: qty 30

## 2019-12-31 MED ORDER — PHENYLEPHRINE HCL-NACL 10-0.9 MG/250ML-% IV SOLN
INTRAVENOUS | Status: DC | PRN
  Administered 2019-12-31: 25 ug/min via INTRAVENOUS

## 2019-12-31 MED ORDER — DEXAMETHASONE SODIUM PHOSPHATE 10 MG/ML IJ SOLN
INTRAMUSCULAR | Status: DC | PRN
  Administered 2019-12-31: 10 mg via INTRAVENOUS

## 2019-12-31 MED ORDER — MEPERIDINE HCL 25 MG/ML IJ SOLN: 6.2500 mg | INTRAMUSCULAR | Status: DC | PRN

## 2019-12-31 MED ORDER — MIDAZOLAM HCL 5 MG/5ML IJ SOLN
INTRAMUSCULAR | Status: DC | PRN
  Administered 2019-12-31: 2 mg via INTRAVENOUS

## 2019-12-31 MED ORDER — SUGAMMADEX SODIUM 200 MG/2ML IV SOLN
INTRAVENOUS | Status: DC | PRN
  Administered 2019-12-31: 200 mg via INTRAVENOUS

## 2019-12-31 MED ORDER — OXYCODONE-ACETAMINOPHEN 5-325 MG PO TABS: 1.0000 | ORAL_TABLET | ORAL | 0 refills | Status: DC | PRN

## 2019-12-31 MED ORDER — MIDAZOLAM HCL 2 MG/2ML IJ SOLN
INTRAMUSCULAR | Status: AC
  Filled 2019-12-31: qty 2

## 2019-12-31 MED ORDER — ROCURONIUM BROMIDE 10 MG/ML (PF) SYRINGE
PREFILLED_SYRINGE | INTRAVENOUS | Status: DC | PRN
  Administered 2019-12-31: 60 mg via INTRAVENOUS
  Administered 2019-12-31: 40 mg via INTRAVENOUS

## 2019-12-31 MED ORDER — HYDROMORPHONE HCL 1 MG/ML IJ SOLN: 0.2500 mg | INTRAMUSCULAR | Status: DC | PRN

## 2019-12-31 MED ORDER — HYDROCODONE-ACETAMINOPHEN 7.5-325 MG PO TABS
1.0000 | ORAL_TABLET | Freq: Once | ORAL | Status: AC | PRN
  Administered 2019-12-31: 1 via ORAL

## 2019-12-31 MED ORDER — BUPIVACAINE LIPOSOME 1.3 % IJ SUSP
20.0000 mL | Freq: Once | INTRAMUSCULAR | Status: DC
  Filled 2019-12-31: qty 20

## 2019-12-31 MED ORDER — THROMBIN (RECOMBINANT) 20000 UNITS EX SOLR
CUTANEOUS | Status: AC
  Filled 2019-12-31: qty 20000

## 2019-12-31 MED ORDER — PROPOFOL 10 MG/ML IV BOLUS
INTRAVENOUS | Status: DC | PRN
  Administered 2019-12-31: 170 mg via INTRAVENOUS
  Administered 2019-12-31: 30 mg via INTRAVENOUS

## 2019-12-31 MED ORDER — METHOCARBAMOL 500 MG PO TABS: 500.0000 mg | ORAL_TABLET | Freq: Four times a day (QID) | ORAL | 0 refills | Status: DC | PRN

## 2019-12-31 MED ORDER — PHENYLEPHRINE 40 MCG/ML (10ML) SYRINGE FOR IV PUSH (FOR BLOOD PRESSURE SUPPORT)
PREFILLED_SYRINGE | INTRAVENOUS | Status: AC
  Filled 2019-12-31: qty 10

## 2019-12-31 MED ORDER — METHYLPREDNISOLONE ACETATE 40 MG/ML IJ SUSP
INTRAMUSCULAR | Status: DC | PRN
  Administered 2019-12-31: 40 mg

## 2019-12-31 MED ORDER — LACTATED RINGERS IV SOLN: INTRAVENOUS | Status: DC

## 2019-12-31 MED ORDER — 0.9 % SODIUM CHLORIDE (POUR BTL) OPTIME
TOPICAL | Status: DC | PRN
  Administered 2019-12-31: 16:00:00 1000 mL

## 2019-12-31 SURGICAL SUPPLY — 69 items
BENZOIN TINCTURE PRP APPL 2/3 (GAUZE/BANDAGES/DRESSINGS) ×3 IMPLANT
BUR ROUND FLUTED 4 SOFT TCH (BURR) ×2 IMPLANT
BUR ROUND FLUTED 4MM SOFT TCH (BURR) ×1
CABLE BIPOLOR RESECTION CORD (MISCELLANEOUS) ×3 IMPLANT
CANISTER SUCT 3000ML PPV (MISCELLANEOUS) ×3 IMPLANT
CARTRIDGE OIL MAESTRO DRILL (MISCELLANEOUS) ×1 IMPLANT
CLOSURE WOUND 1/2 X4 (GAUZE/BANDAGES/DRESSINGS) ×1
COVER SURGICAL LIGHT HANDLE (MISCELLANEOUS) ×3 IMPLANT
COVER WAND RF STERILE (DRAPES) ×3 IMPLANT
DIFFUSER DRILL AIR PNEUMATIC (MISCELLANEOUS) ×3 IMPLANT
DRAIN CHANNEL 15F RND FF W/TCR (WOUND CARE) IMPLANT
DRAPE POUCH INSTRU U-SHP 10X18 (DRAPES) ×3 IMPLANT
DRAPE SURG 17X23 STRL (DRAPES) ×12 IMPLANT
DURAPREP 26ML APPLICATOR (WOUND CARE) ×3 IMPLANT
ELECT BLADE 4.0 EZ CLEAN MEGAD (MISCELLANEOUS) ×3
ELECT CAUTERY BLADE 6.4 (BLADE) ×3 IMPLANT
ELECT REM PT RETURN 9FT ADLT (ELECTROSURGICAL) ×3
ELECTRODE BLDE 4.0 EZ CLN MEGD (MISCELLANEOUS) ×1 IMPLANT
ELECTRODE REM PT RTRN 9FT ADLT (ELECTROSURGICAL) ×1 IMPLANT
EVACUATOR SILICONE 100CC (DRAIN) IMPLANT
FILTER STRAW FLUID ASPIR (MISCELLANEOUS) ×3 IMPLANT
GAUZE 4X4 16PLY RFD (DISPOSABLE) ×6 IMPLANT
GAUZE SPONGE 4X4 12PLY STRL (GAUZE/BANDAGES/DRESSINGS) ×3 IMPLANT
GLOVE BIO SURGEON STRL SZ7 (GLOVE) ×3 IMPLANT
GLOVE BIO SURGEON STRL SZ8 (GLOVE) ×3 IMPLANT
GLOVE BIOGEL PI IND STRL 7.0 (GLOVE) ×1 IMPLANT
GLOVE BIOGEL PI IND STRL 8 (GLOVE) ×1 IMPLANT
GLOVE BIOGEL PI INDICATOR 7.0 (GLOVE) ×2
GLOVE BIOGEL PI INDICATOR 8 (GLOVE) ×2
GOWN STRL REUS W/ TWL LRG LVL3 (GOWN DISPOSABLE) ×1 IMPLANT
GOWN STRL REUS W/ TWL XL LVL3 (GOWN DISPOSABLE) ×2 IMPLANT
GOWN STRL REUS W/TWL LRG LVL3 (GOWN DISPOSABLE) ×2
GOWN STRL REUS W/TWL XL LVL3 (GOWN DISPOSABLE) ×4
IV CATH 14GX2 1/4 (CATHETERS) ×3 IMPLANT
KIT BASIN OR (CUSTOM PROCEDURE TRAY) ×3 IMPLANT
KIT POSITION SURG JACKSON T1 (MISCELLANEOUS) ×3 IMPLANT
KIT TURNOVER KIT B (KITS) ×3 IMPLANT
NEEDLE 18GX1X1/2 (RX/OR ONLY) (NEEDLE) ×3 IMPLANT
NEEDLE 22X1 1/2 (OR ONLY) (NEEDLE) ×3 IMPLANT
NEEDLE HYPO 25GX1X1/2 BEV (NEEDLE) ×3 IMPLANT
NEEDLE SPNL 18GX3.5 QUINCKE PK (NEEDLE) ×6 IMPLANT
NS IRRIG 1000ML POUR BTL (IV SOLUTION) ×3 IMPLANT
OIL CARTRIDGE MAESTRO DRILL (MISCELLANEOUS) ×3
PACK LAMINECTOMY ORTHO (CUSTOM PROCEDURE TRAY) ×3 IMPLANT
PACK UNIVERSAL I (CUSTOM PROCEDURE TRAY) ×3 IMPLANT
PAD ARMBOARD 7.5X6 YLW CONV (MISCELLANEOUS) ×6 IMPLANT
SPONGE INTESTINAL PEANUT (DISPOSABLE) ×3 IMPLANT
SPONGE SURGIFOAM ABS GEL 100 (HEMOSTASIS) ×3 IMPLANT
SPONGE SURGIFOAM ABS GEL SZ50 (HEMOSTASIS) ×3 IMPLANT
STRIP CLOSURE SKIN 1/2X4 (GAUZE/BANDAGES/DRESSINGS) ×2 IMPLANT
SURGIFLO W/THROMBIN 8M KIT (HEMOSTASIS) ×3 IMPLANT
SUT MNCRL AB 4-0 PS2 18 (SUTURE) ×3 IMPLANT
SUT VIC AB 0 CT1 18XCR BRD 8 (SUTURE) IMPLANT
SUT VIC AB 0 CT1 27 (SUTURE)
SUT VIC AB 0 CT1 27XBRD ANBCTR (SUTURE) IMPLANT
SUT VIC AB 0 CT1 8-18 (SUTURE)
SUT VIC AB 1 CT1 18XCR BRD 8 (SUTURE) ×1 IMPLANT
SUT VIC AB 1 CT1 8-18 (SUTURE) ×2
SUT VIC AB 2-0 CT2 18 VCP726D (SUTURE) ×3 IMPLANT
SYR 20ML LL LF (SYRINGE) ×3 IMPLANT
SYR BULB IRRIGATION 50ML (SYRINGE) ×3 IMPLANT
SYR CONTROL 10ML LL (SYRINGE) ×6 IMPLANT
SYR TB 1ML 25GX5/8 (SYRINGE) ×6 IMPLANT
SYR TB 1ML LUER SLIP (SYRINGE) ×6 IMPLANT
TAPE CLOTH SURG 6X10 WHT LF (GAUZE/BANDAGES/DRESSINGS) ×3 IMPLANT
TOWEL GREEN STERILE (TOWEL DISPOSABLE) ×3 IMPLANT
TOWEL GREEN STERILE FF (TOWEL DISPOSABLE) ×3 IMPLANT
WATER STERILE IRR 1000ML POUR (IV SOLUTION) ×3 IMPLANT
YANKAUER SUCT BULB TIP NO VENT (SUCTIONS) ×3 IMPLANT

## 2019-12-31 NOTE — H&P (Signed)
PREOPERATIVE H&P  Chief Complaint: Left leg pain  HPI: Maurice Chavez is a 49 y.o. male who presents with ongoing pain in the left leg  MRI reveals a large left L5/S1 HNP  Patient has failed multiple forms of conservative care and continues to have pain (see office notes for additional details regarding the patient's full course of treatment)  Past Medical History:  Diagnosis Date  . Anxiety   . Asthma   . Pneumonia    h/o pneu    Social History   Socioeconomic History  . Marital status: Married    Spouse name: Not on file  . Number of children: Not on file  . Years of education: Not on file  . Highest education level: Not on file  Occupational History  . Not on file  Tobacco Use  . Smoking status: Never Smoker  . Smokeless tobacco: Never Used  Substance and Sexual Activity  . Alcohol use: Yes    Comment: couple glasses wine per night  . Drug use: Never  . Sexual activity: Not on file  Other Topics Concern  . Not on file  Social History Narrative  . Not on file   Social Determinants of Health   Financial Resource Strain:   . Difficulty of Paying Living Expenses: Not on file  Food Insecurity:   . Worried About Charity fundraiser in the Last Year: Not on file  . Ran Out of Food in the Last Year: Not on file  Transportation Needs:   . Lack of Transportation (Medical): Not on file  . Lack of Transportation (Non-Medical): Not on file  Physical Activity:   . Days of Exercise per Week: Not on file  . Minutes of Exercise per Session: Not on file  Stress:   . Feeling of Stress : Not on file  Social Connections:   . Frequency of Communication with Friends and Family: Not on file  . Frequency of Social Gatherings with Friends and Family: Not on file  . Attends Religious Services: Not on file  . Active Member of Clubs or Organizations: Not on file  . Attends Archivist Meetings: Not on file  . Marital Status: Not on file   No family history on  file. No Known Allergies Prior to Admission medications   Medication Sig Start Date End Date Taking? Authorizing Provider  acetaminophen (TYLENOL) 500 MG tablet Take 1,000 mg by mouth daily as needed.   Yes [provider]  Cholecalciferol (DIALYVITE VITAMIN D 5000) 125 MCG (5000 UT) capsule Take 5,000 Units by mouth daily.   Yes [provider]  citalopram (CELEXA) 10 MG tablet Take 10 mg by mouth daily.  01/14/15  Yes [provider]  clonazePAM (KLONOPIN) 0.5 MG tablet Take 0.5 mg by mouth at bedtime as needed for sleep. 12/22/19  Yes [provider]  cyclobenzaprine (FLEXERIL) 10 MG tablet Take 5-10 mg by mouth every 8 (eight) hours as needed for muscle spasms. 12/14/19  Yes [provider]  fluticasone furoate-vilanterol (BREO ELLIPTA) 200-25 MCG/INH AEPB Inhale 1 puff into the lungs daily.  11/14/19  Yes [provider]  gabapentin (NEURONTIN) 300 MG capsule Take 300-600 mg by mouth 3 (three) times daily as needed (pain).   Yes [provider]  ibuprofen (ADVIL) 200 MG tablet Take 600 mg by mouth every 8 (eight) hours as needed for headache or moderate pain.   Yes [provider]  Multiple Vitamin (MULTIVITAMIN WITH MINERALS) TABS  tablet Take 1 tablet by mouth daily.   Yes [provider]  Omega-3 Fatty Acids (FISH OIL) 1000 MG CAPS Take 2,000 mg by mouth daily.   Yes [provider]  oxyCODONE-acetaminophen (PERCOCET/ROXICET) 5-325 MG tablet Take 1 tablet by mouth every 6 (six) hours as needed for pain. 12/22/19  Yes [provider]  Polyethyl Glycol-Propyl Glycol (SYSTANE OP) Place 1 drop into both eyes daily as needed (dry eyes).   Yes [provider]  predniSONE (DELTASONE) 10 MG tablet Take 10-20 mg by mouth See admin instructions. Take 20 mg for 2 days then 10 mg for 2 days then stop  12/14/19  Yes [provider]  Probiotic CAPS Take 2 capsules by mouth daily.   Yes [provider]  vitamin B-12 (CYANOCOBALAMIN) 1000 MCG tablet Take 1,000 mcg by mouth daily.   Yes [provider]  oxyCODONE-acetaminophen (PERCOCET) 10-325 MG tablet Take 1 tablet by mouth every 8 (eight) hours as needed for pain. Patient not taking: Reported on 12/24/2019 12/15/19   Monica Becton, MD  predniSONE (DELTASONE) 50 MG tablet One tab PO daily for 5 days. Patient not taking: Reported on 12/24/2019 12/15/19   Monica Becton, MD     All other systems have been reviewed and were otherwise negative with the exception of those mentioned in the HPI and as above.  Physical Exam: There were no vitals filed for this visit.  There is no height or weight on file to calculate BMI.  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

## 2019-12-31 NOTE — Op Note (Addendum)
PATIENT NAME: Maurice Chavez   MEDICAL RECORD NO.:   [id number]   DATE OF BIRTH: 02-03-1971   DATE OF PROCEDURE: 3/3/20201                                      OPERATIVE REPORT     PREOPERATIVE DIAGNOSES: 1. Left-sided S1 radiculopathy. 2. 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   POSTOPERATIVE DIAGNOSES: 1. Left-sided S1 radiculopathy. 2. 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   PROCEDURES:  Left-sided L5-S1 laminotomy with partial facetectomy and removal of large herniated left-sided L5-S1 disk fragment.   SURGEON:  Phylliss Bob, MD.   ASSISTANTPricilla Holm, PA-C.   ANESTHESIA:  General endotracheal anesthesia.   COMPLICATIONS:  None.   DISPOSITION:  Stable.   ESTIMATED BLOOD LOSS:  Minimal.   INDICATIONS FOR SURGERY:  Briefly, Maurice Chavez is a pleasant 49 year old male who did present to me with severe pain in the left leg.  The patient's MRI did reveal the findings outlined above, clearly notable for a large herniated disk fragment. The pain was rather severe.  We did discuss treatment options and we did ultimately elect to proceed with the procedure reflected above.  The patient was fully made aware of the risks of surgery, including the risk of recurrent herniation and the need for subsequent surgery, including the possibility of a subsequent diskectomy and/or fusion.   OPERATIVE DETAILS:  On 12/31/2019, the patient was brought to surgery and general endotracheal anesthesia was administered.  The patient was placed prone on a well-padded flat Jackson bed with a spinal frame.  Antibiotics were given.  The back was prepped and draped and a time-out procedure was performed.  At this point, a midline incision was made directly over the L5-S1 intervertebral space.  A curvilinear incision was made just to the left of the midline into the fascia.   A self-retaining McCulloch retractor was placed.  The lamina of L5 and S1 was identified and subperiosteally exposed.  I then removed the lateral aspect of the L5-S1 ligamentum flavum.  I then removed the inferior aspect of the S1 lamina on the left, in the region of the traversing left S1 nerve. Readily identified was the traversing left S1 nerve, which was noted to be under obvious tension, and was noted to be rather erythematous.  I was able to gently gain medial retraction of the nerve, and in doing so, a very large herniated disk fragment was readily noted.  This was removed in one very large fragment.    At this point, the spinal canal and traversing left S1 nerve was evaluated, and there was no compression noted.  I was very pleased with the final decompression that I was able to accomplish.  At this point, the wound was copiously irrigated with normal saline.  All epidural bleeding was controlled using bipolar electrocautery in addition to Surgiflo. All bleeding was controlled at the termination of the procedure.  At this point, 20 mg of Depo-Medrol was introduced about the epidural space in the region of the right S1 nerve.  The wound was then closed in layers using #1 Vicryl followed by 0 Vicryl, followed by 4-0 Monocryl. Benzoin and Steri-Strips were applied followed by a sterile dressing. All instrument counts were correct at the termination  of the procedure.   Of note, Maurice Chavez was my assistant throughout surgery, and did aid in retraction, suctioning, and closure from start to finish.     Maurice Chavez,

## 2019-12-31 NOTE — Anesthesia Postprocedure Evaluation (Signed)
Anesthesia Post Note  Patient: Arless Vineyard  Procedure(s) Performed: LEFT-SIDED LUMBAR FIVE - SACRUM ONE MICRODISECTOMY (Left Spine Lumbar)     Patient location during evaluation: PACU Anesthesia Type: General Level of consciousness: awake and alert and oriented Pain management: pain level controlled Vital Signs Assessment: post-procedure vital signs reviewed and stable Respiratory status: spontaneous breathing, nonlabored ventilation and respiratory function stable Cardiovascular status: blood pressure returned to baseline and stable Postop Assessment: no apparent nausea or vomiting Anesthetic complications: no    Last Vitals:  Vitals:   12/31/19 1630 12/31/19 1645  BP: 118/78 107/77  Pulse: 76 76  Resp: 17 13  Temp: 36.8 C 36.7 C  SpO2: 98% 95%    Last Pain:  Vitals:   12/31/19 1630  TempSrc:   PainSc: 0-No pain                 Annelie Boak A.

## 2019-12-31 NOTE — Anesthesia Procedure Notes (Signed)
Procedure Name: Intubation Date/Time: 12/31/2019 2:37 PM Performed by: Lovie Chol, CRNA Pre-anesthesia Checklist: Patient identified, Emergency Drugs available, Suction available and Patient being monitored Patient Re-evaluated:Patient Re-evaluated prior to induction Oxygen Delivery Method: Circle System Utilized Preoxygenation: Pre-oxygenation with 100% oxygen Induction Type: IV induction Ventilation: Mask ventilation without difficulty Laryngoscope Size: Miller and 3 Grade View: Grade I Tube type: Oral Tube size: 7.5 mm Number of attempts: 1 Airway Equipment and Method: Stylet and Oral airway Placement Confirmation: ETT inserted through vocal cords under direct vision,  positive ETCO2 and breath sounds checked- equal and bilateral Secured at: 21 cm Tube secured with: Tape Dental Injury: Teeth and Oropharynx as per pre-operative assessment

## 2019-12-31 NOTE — Transfer of Care (Signed)
Immediate Anesthesia Transfer of Care Note  Patient: Maurice Chavez  Procedure(s) Performed: LEFT-SIDED LUMBAR FIVE - SACRUM ONE MICRODISECTOMY (Left Spine Lumbar)  Patient Location: PACU  Anesthesia Type:General  Level of Consciousness: awake, oriented, drowsy and patient cooperative  Airway & Oxygen Therapy: Patient Spontanous Breathing and Patient connected to nasal cannula oxygen  Post-op Assessment: Report given to RN and Post -op Vital signs reviewed and stable  Post vital signs: Reviewed  Last Vitals:  Vitals Value Taken Time  BP 118/78 12/31/19 1633  Temp    Pulse 74 12/31/19 1634  Resp 14 12/31/19 1634  SpO2 97 % 12/31/19 1634  Vitals shown include unvalidated device data.  Last Pain:  Vitals:   12/31/19 1630  TempSrc:   PainSc: (P) 0-No pain         Complications: No apparent anesthesia complications

## 2020-01-01 MED FILL — Thrombin (Recombinant) For Soln 20000 Unit: CUTANEOUS | Qty: 1 | Status: AC

## 2020-01-14 DIAGNOSIS — Z9889 Other specified postprocedural states: Secondary | ICD-10-CM | POA: Diagnosis not present

## 2020-01-28 ENCOUNTER — Other Ambulatory Visit: Payer: Self-pay

## 2020-01-28 ENCOUNTER — Ambulatory Visit (INDEPENDENT_AMBULATORY_CARE_PROVIDER_SITE_OTHER): Payer: BLUE CROSS/BLUE SHIELD | Admitting: Sports Medicine

## 2020-01-28 DIAGNOSIS — M5416 Radiculopathy, lumbar region: Secondary | ICD-10-CM

## 2020-01-28 NOTE — Progress Notes (Signed)
    Procedures performed today:    None.  Independent interpretation of notes and tests performed by another provider:   None.  Brief History, Exam, Impression, and Recommendations:    Left lumbar radiculopathy Maurice Chavez returns, he is a pleasant 49 year old male, he had a large herniated disc, we did some additional pain management, obtained an MRI and got him in with Dr. Yevette Edwards performed microdiscectomy, he returns today feeling significantly better, with expected postoperative pain, he is extremely happy with his results, extremely happy with his care with Dr. Yevette Edwards and Maurice Chavez, he can return to see me as needed.    ___________________________________________ Ihor Austin. Benjamin Stain, M.D., ABFM., CAQSM. Primary Care and Sports Medicine [facility]  Adjunct Instructor of Family Medicine  [employer]

## 2020-01-28 NOTE — Assessment & Plan Note (Signed)
Maurice Chavez returns, he is a pleasant 49 year old male, he had a large herniated disc, we did some additional pain management, obtained an MRI and got him in with Dr. Yevette Edwards performed microdiscectomy, he returns today feeling significantly better, with expected postoperative pain, he is extremely happy with his results, extremely happy with his care with Dr. Yevette Edwards and Dorathy Daft, he can return to see me as needed.

## 2020-02-10 DIAGNOSIS — Z9889 Other specified postprocedural states: Secondary | ICD-10-CM | POA: Diagnosis not present

## 2020-05-27 DIAGNOSIS — J45909 Unspecified asthma, uncomplicated: Secondary | ICD-10-CM | POA: Diagnosis not present

## 2020-05-27 DIAGNOSIS — J309 Allergic rhinitis, unspecified: Secondary | ICD-10-CM | POA: Diagnosis not present

## 2020-08-27 DIAGNOSIS — Z131 Encounter for screening for diabetes mellitus: Secondary | ICD-10-CM | POA: Diagnosis not present

## 2020-08-27 DIAGNOSIS — E538 Deficiency of other specified B group vitamins: Secondary | ICD-10-CM | POA: Diagnosis not present

## 2020-08-27 DIAGNOSIS — Z125 Encounter for screening for malignant neoplasm of prostate: Secondary | ICD-10-CM | POA: Diagnosis not present

## 2020-08-27 DIAGNOSIS — Z13220 Encounter for screening for lipoid disorders: Secondary | ICD-10-CM | POA: Diagnosis not present

## 2020-08-27 DIAGNOSIS — Z79899 Other long term (current) drug therapy: Secondary | ICD-10-CM | POA: Diagnosis not present

## 2020-08-27 DIAGNOSIS — Z0000 Encounter for general adult medical examination without abnormal findings: Secondary | ICD-10-CM | POA: Diagnosis not present

## 2020-11-03 DIAGNOSIS — K429 Umbilical hernia without obstruction or gangrene: Secondary | ICD-10-CM | POA: Diagnosis not present

## 2020-11-04 DIAGNOSIS — K429 Umbilical hernia without obstruction or gangrene: Secondary | ICD-10-CM | POA: Diagnosis not present

## 2020-11-04 DIAGNOSIS — K436 Other and unspecified ventral hernia with obstruction, without gangrene: Secondary | ICD-10-CM | POA: Diagnosis not present

## 2020-11-04 DIAGNOSIS — K420 Umbilical hernia with obstruction, without gangrene: Secondary | ICD-10-CM | POA: Diagnosis not present

## 2020-11-04 DIAGNOSIS — K430 Incisional hernia with obstruction, without gangrene: Secondary | ICD-10-CM | POA: Diagnosis not present

## 2021-01-11 DIAGNOSIS — J309 Allergic rhinitis, unspecified: Secondary | ICD-10-CM | POA: Diagnosis not present

## 2021-01-11 DIAGNOSIS — J45901 Unspecified asthma with (acute) exacerbation: Secondary | ICD-10-CM | POA: Diagnosis not present

## 2021-01-11 DIAGNOSIS — J45909 Unspecified asthma, uncomplicated: Secondary | ICD-10-CM | POA: Diagnosis not present

## 2021-01-11 DIAGNOSIS — U071 COVID-19: Secondary | ICD-10-CM | POA: Diagnosis not present

## 2021-01-16 DIAGNOSIS — Z79899 Other long term (current) drug therapy: Secondary | ICD-10-CM | POA: Diagnosis not present

## 2021-01-16 DIAGNOSIS — F419 Anxiety disorder, unspecified: Secondary | ICD-10-CM | POA: Diagnosis not present

## 2021-01-16 DIAGNOSIS — R059 Cough, unspecified: Secondary | ICD-10-CM | POA: Diagnosis not present

## 2021-01-16 DIAGNOSIS — Z7951 Long term (current) use of inhaled steroids: Secondary | ICD-10-CM | POA: Diagnosis not present

## 2021-01-16 DIAGNOSIS — R509 Fever, unspecified: Secondary | ICD-10-CM | POA: Diagnosis not present

## 2021-01-16 DIAGNOSIS — J45909 Unspecified asthma, uncomplicated: Secondary | ICD-10-CM | POA: Diagnosis not present

## 2021-01-16 DIAGNOSIS — Z7952 Long term (current) use of systemic steroids: Secondary | ICD-10-CM | POA: Diagnosis not present

## 2021-01-16 DIAGNOSIS — R062 Wheezing: Secondary | ICD-10-CM | POA: Diagnosis not present

## 2021-01-16 DIAGNOSIS — U071 COVID-19: Secondary | ICD-10-CM | POA: Diagnosis not present

## 2021-01-16 DIAGNOSIS — J1282 Pneumonia due to coronavirus disease 2019: Secondary | ICD-10-CM | POA: Diagnosis not present

## 2021-01-16 DIAGNOSIS — R0602 Shortness of breath: Secondary | ICD-10-CM | POA: Diagnosis not present

## 2021-01-28 DIAGNOSIS — J4520 Mild intermittent asthma, uncomplicated: Secondary | ICD-10-CM | POA: Diagnosis not present

## 2021-01-31 DIAGNOSIS — Z8616 Personal history of COVID-19: Secondary | ICD-10-CM | POA: Diagnosis not present

## 2021-01-31 DIAGNOSIS — J4540 Moderate persistent asthma, uncomplicated: Secondary | ICD-10-CM | POA: Diagnosis not present

## 2021-01-31 DIAGNOSIS — J1282 Pneumonia due to coronavirus disease 2019: Secondary | ICD-10-CM | POA: Diagnosis not present

## 2021-01-31 DIAGNOSIS — J3089 Other allergic rhinitis: Secondary | ICD-10-CM | POA: Diagnosis not present

## 2021-02-18 DIAGNOSIS — E291 Testicular hypofunction: Secondary | ICD-10-CM | POA: Diagnosis not present

## 2021-02-18 DIAGNOSIS — Z79899 Other long term (current) drug therapy: Secondary | ICD-10-CM | POA: Diagnosis not present

## 2021-03-08 DIAGNOSIS — J4541 Moderate persistent asthma with (acute) exacerbation: Secondary | ICD-10-CM | POA: Diagnosis not present

## 2021-03-08 DIAGNOSIS — J209 Acute bronchitis, unspecified: Secondary | ICD-10-CM | POA: Diagnosis not present

## 2021-03-08 DIAGNOSIS — J309 Allergic rhinitis, unspecified: Secondary | ICD-10-CM | POA: Diagnosis not present

## 2021-03-08 DIAGNOSIS — J329 Chronic sinusitis, unspecified: Secondary | ICD-10-CM | POA: Diagnosis not present

## 2021-05-26 IMAGING — CR DG LUMBAR SPINE 2-3V
2 series · 2 of 2 positions shown · non-contrast
Comparison: 12/15/2019 MRI

CLINICAL DATA: L5-S1 micro discectomy

EXAM:
LUMBAR SPINE - 2-3 VIEW

[AP (1 of 2)]
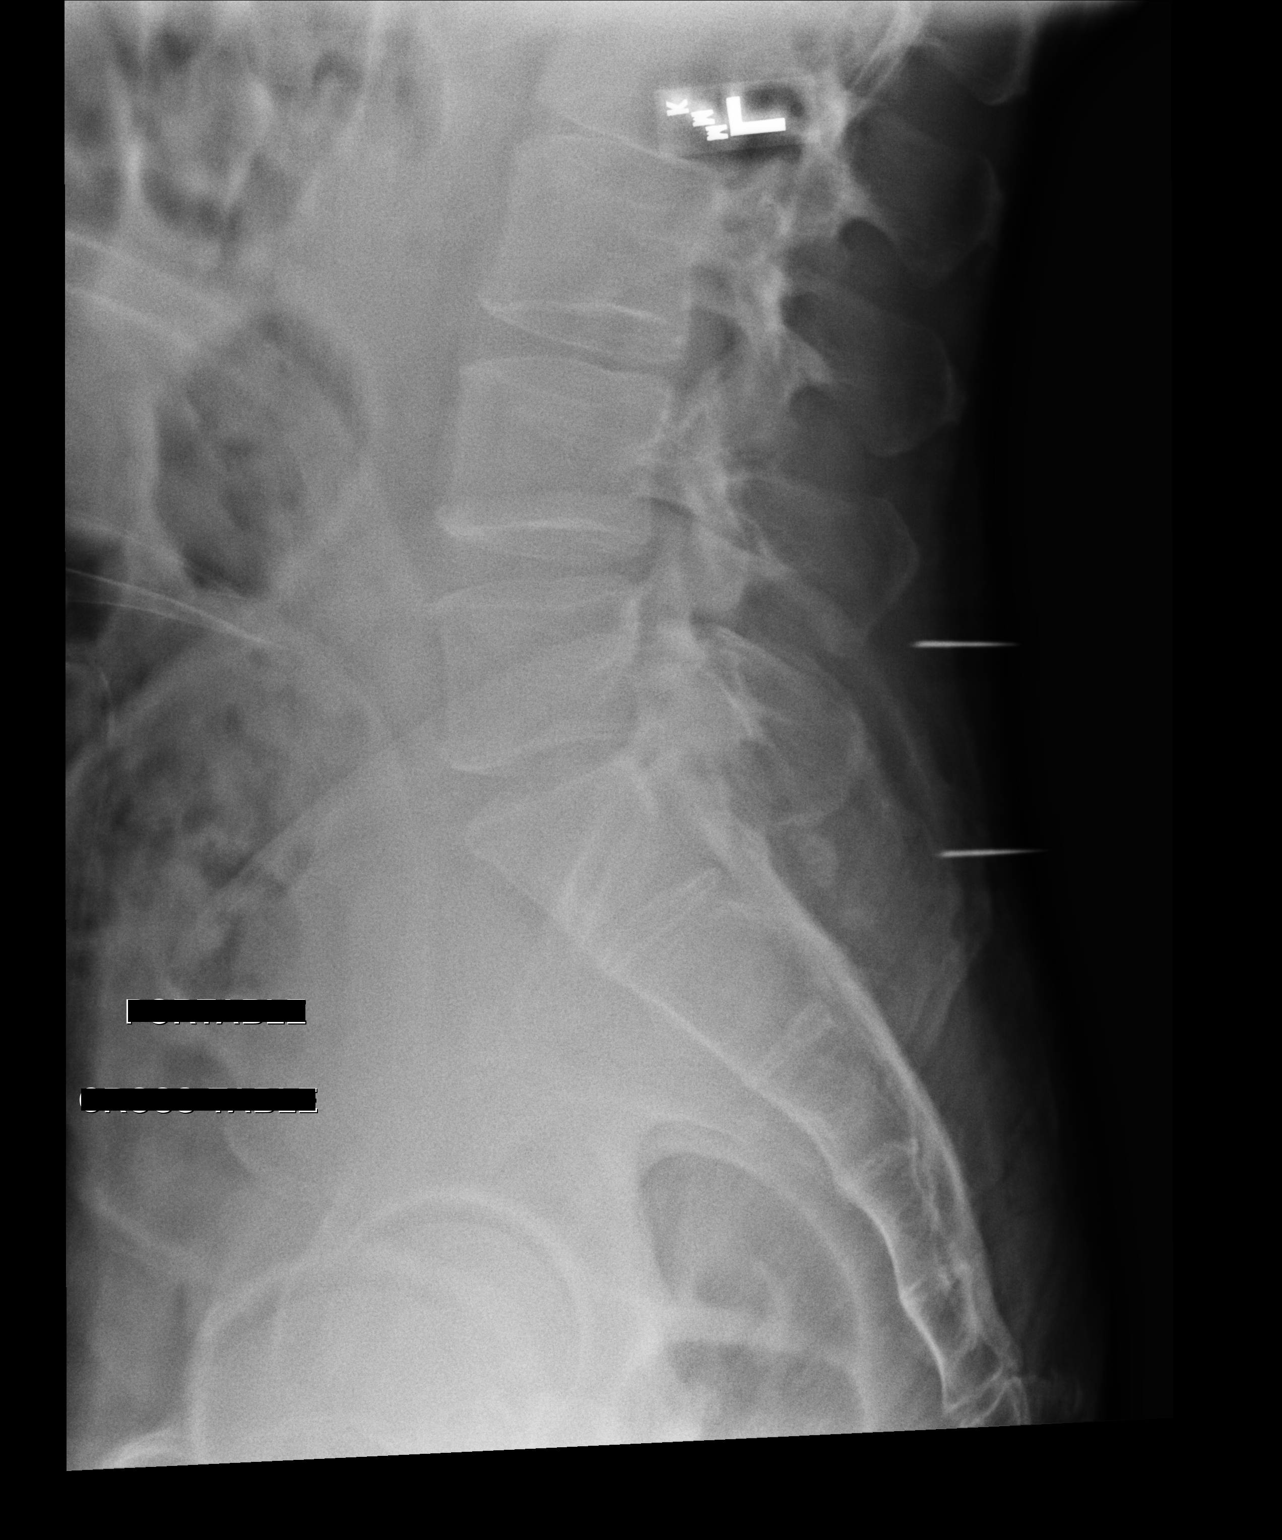

[AP (2 of 2)]
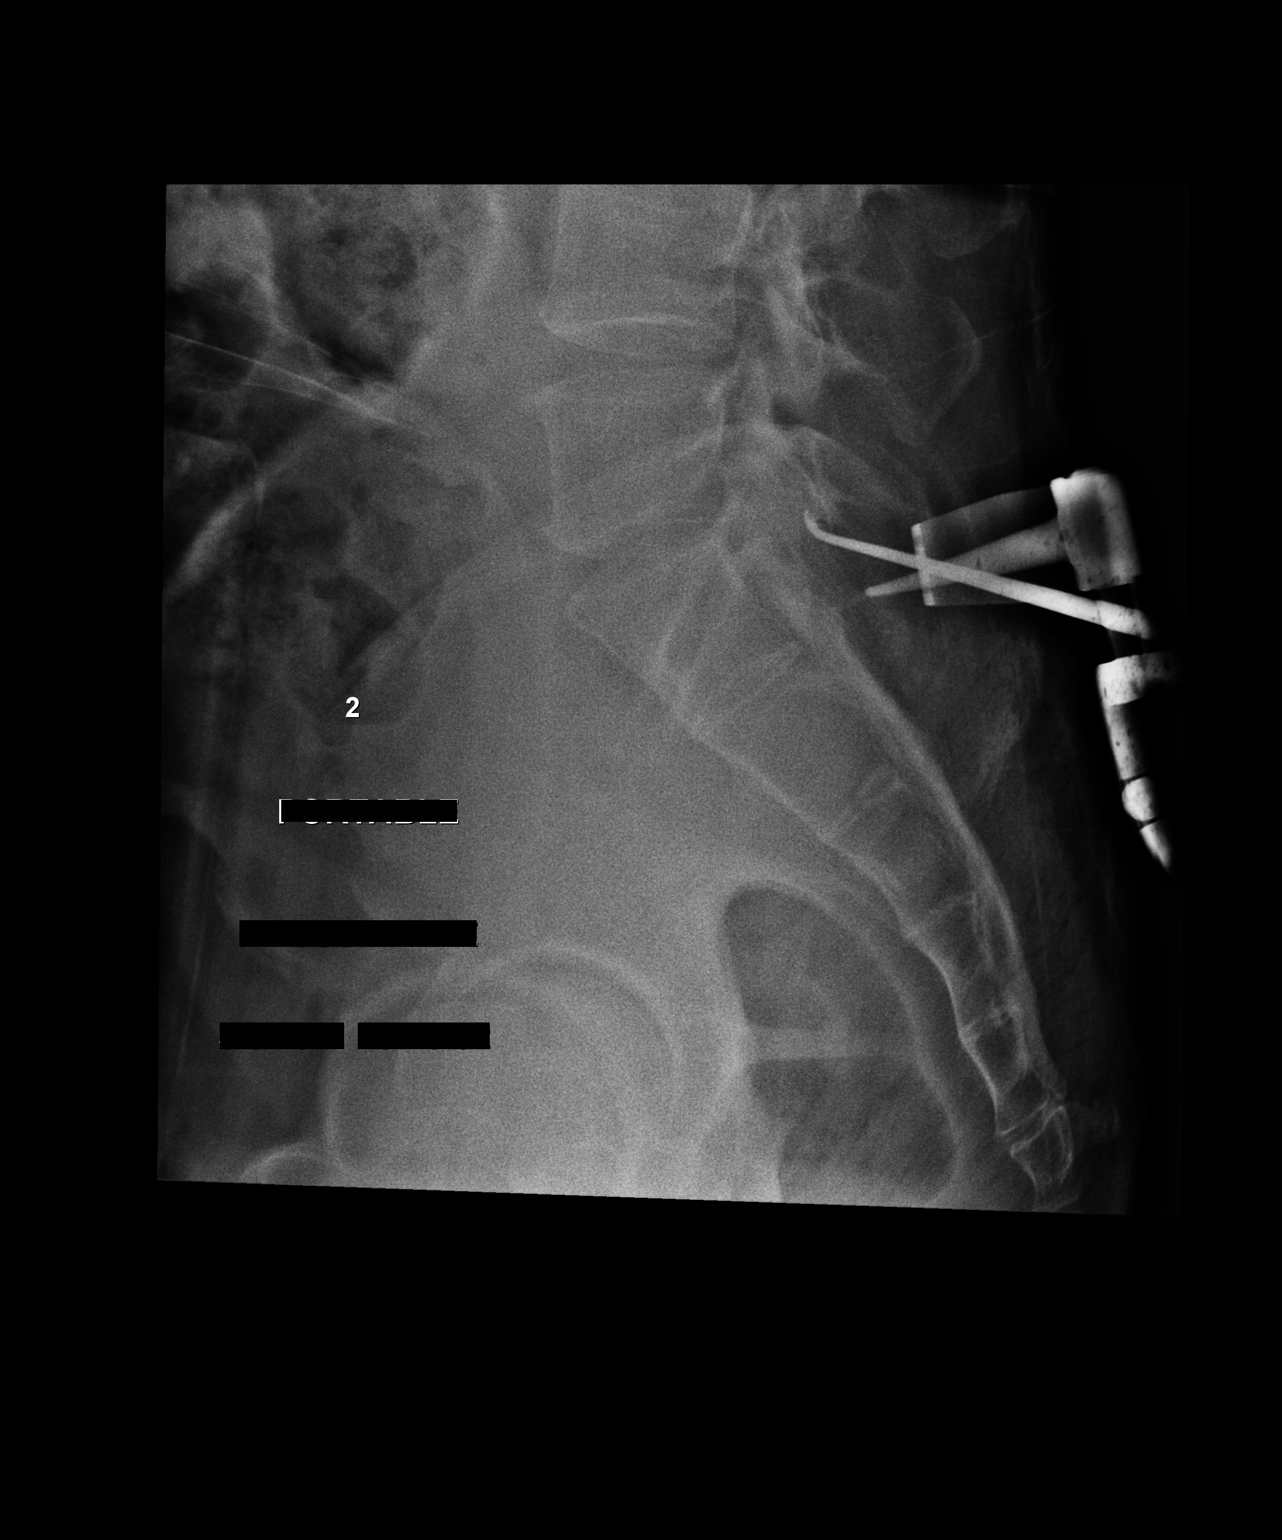

[2 of 2 positions shown; findings below may reference images not displayed]

FINDINGS: Two lateral views of the lumbar spine are submitted. The initial
image demonstrates localizing instruments posteriorly. The more
cephalad instrument is posterior to the inferior spinous process of
L4. Caudal localizing instrument overlies the soft tissue posterior
to the inferior S1 segment. Subsequent image demonstrates linear
surgical instrument overlying the posterior elements at L5-S1.
IMPRESSION: Limited lateral views of the lumbar spine obtained intraoperatively
for localization purposes.

## 2021-06-20 DIAGNOSIS — J45909 Unspecified asthma, uncomplicated: Secondary | ICD-10-CM | POA: Diagnosis not present

## 2021-06-20 DIAGNOSIS — F411 Generalized anxiety disorder: Secondary | ICD-10-CM | POA: Diagnosis not present

## 2021-06-27 DIAGNOSIS — J309 Allergic rhinitis, unspecified: Secondary | ICD-10-CM | POA: Diagnosis not present

## 2021-06-27 DIAGNOSIS — J4541 Moderate persistent asthma with (acute) exacerbation: Secondary | ICD-10-CM | POA: Diagnosis not present

## 2021-08-05 DIAGNOSIS — D122 Benign neoplasm of ascending colon: Secondary | ICD-10-CM | POA: Diagnosis not present

## 2021-08-05 DIAGNOSIS — D120 Benign neoplasm of cecum: Secondary | ICD-10-CM | POA: Diagnosis not present

## 2021-08-05 DIAGNOSIS — Z1211 Encounter for screening for malignant neoplasm of colon: Secondary | ICD-10-CM | POA: Diagnosis not present

## 2021-08-23 DIAGNOSIS — Z23 Encounter for immunization: Secondary | ICD-10-CM | POA: Diagnosis not present

## 2021-08-23 DIAGNOSIS — Z0000 Encounter for general adult medical examination without abnormal findings: Secondary | ICD-10-CM | POA: Diagnosis not present

## 2021-08-23 DIAGNOSIS — F411 Generalized anxiety disorder: Secondary | ICD-10-CM | POA: Diagnosis not present

## 2021-08-23 DIAGNOSIS — Z125 Encounter for screening for malignant neoplasm of prostate: Secondary | ICD-10-CM | POA: Diagnosis not present

## 2021-08-23 DIAGNOSIS — Z13220 Encounter for screening for lipoid disorders: Secondary | ICD-10-CM | POA: Diagnosis not present

## 2021-08-23 DIAGNOSIS — J4541 Moderate persistent asthma with (acute) exacerbation: Secondary | ICD-10-CM | POA: Diagnosis not present

## 2021-10-19 DIAGNOSIS — R220 Localized swelling, mass and lump, head: Secondary | ICD-10-CM | POA: Diagnosis not present

## 2023-07-18 ENCOUNTER — Encounter: Payer: Self-pay | Admitting: Sports Medicine

## 2023-07-18 ENCOUNTER — Ambulatory Visit (INDEPENDENT_AMBULATORY_CARE_PROVIDER_SITE_OTHER): Payer: BC Managed Care – PPO

## 2023-07-18 ENCOUNTER — Ambulatory Visit (INDEPENDENT_AMBULATORY_CARE_PROVIDER_SITE_OTHER): Payer: BLUE CROSS/BLUE SHIELD | Admitting: Sports Medicine

## 2023-07-18 DIAGNOSIS — M5416 Radiculopathy, lumbar region: Secondary | ICD-10-CM

## 2023-07-18 MED ORDER — PREDNISONE 50 MG PO TABS
ORAL_TABLET | ORAL | 0 refills | Status: AC
Start: 2023-07-18 — End: ?

## 2023-07-18 MED ORDER — OXYCODONE-ACETAMINOPHEN 5-325 MG PO TABS: 0.5000 | ORAL_TABLET | Freq: Three times a day (TID) | ORAL | 0 refills | Status: AC | PRN

## 2023-07-18 NOTE — Assessment & Plan Note (Signed)
Maurice Chavez returns, he is a very pleasant 52 year old male, we last saw him in 2021, he had a large herniated disc, ultimately we referred him to Dr. Yevette Edwards who performed a microdiscectomy. He did a lot better, more recently he is having increasing axial back pain, discogenic with radiation to the left lateral thigh but not past the knee. No red flag symptoms. Exam is normal. He does have a trip coming up to [facility] [place] in a few days so we will need to treat him aggressively. Adding 5-day burst of steroids, home physical therapy, updated x-rays, oxycodone.

## 2023-07-18 NOTE — Progress Notes (Signed)
    Procedures performed today:    None.  Independent interpretation of notes and tests performed by another provider:   None.  Brief History, Exam, Impression, and Recommendations:    Left lumbar radiculopathy Maurice Chavez returns, he is a very pleasant 52 year old male, we last saw him in 2021, he had a large herniated disc, ultimately we referred him to Dr. Yevette Edwards who performed a microdiscectomy. He did a lot better, more recently he is having increasing axial back pain, discogenic with radiation to the left lateral thigh but not past the knee. No red flag symptoms. Exam is normal. He does have a trip coming up to [facility] [place] in a few days so we will need to treat him aggressively. Adding 5-day burst of steroids, home physical therapy, updated x-rays, oxycodone.    ____________________________________________ Ihor Austin. Benjamin Stain, M.D., ABFM., CAQSM., AME. Primary Care and Sports Medicine [facility]  Adjunct Professor of Family Medicine  [employer]  Restaurant manager, fast food

## 2023-08-27 ENCOUNTER — Encounter: Payer: Self-pay | Admitting: Sports Medicine

## 2023-08-29 ENCOUNTER — Ambulatory Visit: Payer: BC Managed Care – PPO | Admitting: Sports Medicine

## 2024-07-01 ENCOUNTER — Encounter: Payer: Self-pay | Admitting: Sports Medicine
